# Patient Record
Sex: Male | Born: 1985 | Race: Black or African American | Hispanic: No | Marital: Single | Smoking: Never smoker
Health system: Southern US, Community
[De-identification: ages and names within clinical notes are randomized; demographics above are authoritative.]

## PROBLEM LIST (undated history)

## (undated) DIAGNOSIS — M1990 Unspecified osteoarthritis, unspecified site: Secondary | ICD-10-CM

## (undated) DIAGNOSIS — I10 Essential (primary) hypertension: Secondary | ICD-10-CM

---
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
## 2015-10-26 ENCOUNTER — Encounter (HOSPITAL_COMMUNITY): Payer: Self-pay | Admitting: Emergency Medicine

## 2015-10-26 ENCOUNTER — Emergency Department (HOSPITAL_COMMUNITY)
Admission: EM | Admit: 2015-10-26 | Discharge: 2015-10-26 | Disposition: A | Discharge status: Home / Self Care | Payer: No Typology Code available for payment source | Attending: Emergency Medicine | Admitting: Emergency Medicine

## 2015-10-26 DIAGNOSIS — Y999 Unspecified external cause status: Secondary | ICD-10-CM | POA: Diagnosis not present

## 2015-10-26 DIAGNOSIS — M25511 Pain in right shoulder: Secondary | ICD-10-CM

## 2015-10-26 DIAGNOSIS — Y939 Activity, unspecified: Secondary | ICD-10-CM | POA: Diagnosis not present

## 2015-10-26 DIAGNOSIS — Y92410 Unspecified street and highway as the place of occurrence of the external cause: Secondary | ICD-10-CM | POA: Diagnosis not present

## 2015-10-26 DIAGNOSIS — S161XXA Strain of muscle, fascia and tendon at neck level, initial encounter: Secondary | ICD-10-CM | POA: Diagnosis not present

## 2015-10-26 DIAGNOSIS — I10 Essential (primary) hypertension: Secondary | ICD-10-CM | POA: Diagnosis not present

## 2015-10-26 DIAGNOSIS — S199XXA Unspecified injury of neck, initial encounter: Secondary | ICD-10-CM | POA: Diagnosis present

## 2015-10-26 HISTORY — DX: Unspecified osteoarthritis, unspecified site: M19.90

## 2015-10-26 HISTORY — DX: Essential (primary) hypertension: I10

## 2015-10-26 MED ORDER — METHOCARBAMOL 500 MG PO TABS: 500.0000 mg | ORAL_TABLET | Freq: Every evening | ORAL | 0 refills | Status: DC | PRN

## 2015-10-26 MED ORDER — IBUPROFEN 400 MG PO TABS
600.0000 mg | ORAL_TABLET | Freq: Once | ORAL | Status: AC
  Administered 2015-10-26: 600 mg via ORAL
  Filled 2015-10-26: qty 1
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
## 2015-11-02 ENCOUNTER — Ambulatory Visit (INDEPENDENT_AMBULATORY_CARE_PROVIDER_SITE_OTHER): Payer: Self-pay

## 2015-11-02 ENCOUNTER — Encounter (HOSPITAL_COMMUNITY): Payer: Self-pay | Admitting: Emergency Medicine

## 2015-11-02 ENCOUNTER — Ambulatory Visit (HOSPITAL_COMMUNITY)
Admission: EM | Admit: 2015-11-02 | Discharge: 2015-11-02 | Disposition: A | Discharge status: Home / Self Care | Payer: Self-pay | Attending: Family Medicine | Admitting: Family Medicine

## 2015-11-02 DIAGNOSIS — S161XXD Strain of muscle, fascia and tendon at neck level, subsequent encounter: Secondary | ICD-10-CM

## 2015-11-02 DIAGNOSIS — S39012D Strain of muscle, fascia and tendon of lower back, subsequent encounter: Secondary | ICD-10-CM

## 2015-11-02 MED ORDER — CYCLOBENZAPRINE HCL 10 MG PO TABS: 10.0000 mg | ORAL_TABLET | Freq: Two times a day (BID) | ORAL | 0 refills | Status: DC | PRN
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
## 2018-03-23 ENCOUNTER — Emergency Department (HOSPITAL_COMMUNITY): Payer: Self-pay

## 2018-03-23 ENCOUNTER — Emergency Department (HOSPITAL_COMMUNITY)
Admission: EM | Admit: 2018-03-23 | Discharge: 2018-03-23 | Disposition: A | Discharge status: Home / Self Care | Payer: Self-pay | Attending: Emergency Medicine | Admitting: Emergency Medicine

## 2018-03-23 DIAGNOSIS — I10 Essential (primary) hypertension: Secondary | ICD-10-CM | POA: Insufficient documentation

## 2018-03-23 DIAGNOSIS — J101 Influenza due to other identified influenza virus with other respiratory manifestations: Secondary | ICD-10-CM | POA: Insufficient documentation

## 2018-03-23 LAB — CBC WITH DIFFERENTIAL/PLATELET
Abs Immature Granulocytes: 0.02 10*3/uL (ref 0.00–0.07)
BASOS ABS: 0 10*3/uL (ref 0.0–0.1)
Basophils Relative: 0 %
EOS ABS: 0 10*3/uL (ref 0.0–0.5)
Eosinophils Relative: 0 %
HEMATOCRIT: 47.8 % (ref 39.0–52.0)
HEMOGLOBIN: 15.7 g/dL (ref 13.0–17.0)
Immature Granulocytes: 0 %
LYMPHS ABS: 1.4 10*3/uL (ref 0.7–4.0)
LYMPHS PCT: 18 %
MCH: 27.7 pg (ref 26.0–34.0)
MCHC: 32.8 g/dL (ref 30.0–36.0)
MCV: 84.3 fL (ref 80.0–100.0)
MONO ABS: 0.5 10*3/uL (ref 0.1–1.0)
Monocytes Relative: 7 %
NRBC: 0 % (ref 0.0–0.2)
Neutro Abs: 5.8 10*3/uL (ref 1.7–7.7)
Neutrophils Relative %: 75 %
Platelets: 274 10*3/uL (ref 150–400)
RBC: 5.67 MIL/uL (ref 4.22–5.81)
RDW: 12.6 % (ref 11.5–15.5)
WBC: 7.7 10*3/uL (ref 4.0–10.5)

## 2018-03-23 LAB — URINALYSIS, ROUTINE W REFLEX MICROSCOPIC
BILIRUBIN URINE: NEGATIVE
GLUCOSE, UA: NEGATIVE mg/dL
KETONES UR: NEGATIVE mg/dL
Leukocytes, UA: NEGATIVE
Nitrite: NEGATIVE
PH: 6.5 (ref 5.0–8.0)
Protein, ur: NEGATIVE mg/dL
SPECIFIC GRAVITY, URINE: 1.01 (ref 1.005–1.030)

## 2018-03-23 LAB — BASIC METABOLIC PANEL
ANION GAP: 11 (ref 5–15)
BUN: 10 mg/dL (ref 6–20)
CALCIUM: 8.8 mg/dL — AB (ref 8.9–10.3)
CO2: 20 mmol/L — ABNORMAL LOW (ref 22–32)
CREATININE: 1.19 mg/dL (ref 0.61–1.24)
Chloride: 104 mmol/L (ref 98–111)
GFR calc Af Amer: >60 mL/min (ref >60)
GFR calc non Af Amer: >60 mL/min (ref >60)
Glucose, Bld: 110 mg/dL — ABNORMAL HIGH (ref 70–99)
POTASSIUM: 3.7 mmol/L (ref 3.5–5.1)
Sodium: 135 mmol/L (ref 135–145)

## 2018-03-23 LAB — URINALYSIS, MICROSCOPIC (REFLEX): WBC UA: NONE SEEN WBC/hpf (ref 0–5)

## 2018-03-23 LAB — I-STAT CG4 LACTIC ACID, ED: Lactic Acid, Venous: 1.42 mmol/L (ref 0.5–1.9)

## 2018-03-23 LAB — INFLUENZA PANEL BY PCR (TYPE A & B)
Influenza A By PCR: NEGATIVE
Influenza B By PCR: POSITIVE — AB

## 2018-03-23 MED ORDER — SODIUM CHLORIDE 0.9 % IV BOLUS
1000.0000 mL | Freq: Once | INTRAVENOUS | Status: AC
Start: 2018-03-23 — End: 2018-03-23
  Administered 2018-03-23: 1000 mL via INTRAVENOUS

## 2018-03-23 MED ORDER — ALBUTEROL SULFATE (2.5 MG/3ML) 0.083% IN NEBU
2.5000 mg | INHALATION_SOLUTION | Freq: Once | RESPIRATORY_TRACT | Status: AC
  Administered 2018-03-23: 2.5 mg via RESPIRATORY_TRACT
  Filled 2018-03-23: qty 3

## 2018-03-23 MED ORDER — ACETAMINOPHEN 325 MG PO TABS
650.0000 mg | ORAL_TABLET | Freq: Once | ORAL | Status: AC
Start: 2018-03-23 — End: 2018-03-23
  Administered 2018-03-23: 650 mg via ORAL
  Filled 2018-03-23: qty 2

## 2018-03-23 NOTE — ED Notes (Signed)
ED Provider at bedside. 

## 2018-03-23 NOTE — ED Triage Notes (Signed)
Pt reports flu like symptoms since Tuesday (cough, congestion, chills, body aches) pt taking tylenol and theraflu with no relief of symptoms. Pt also having dizziness.

## 2018-03-23 NOTE — ED Provider Notes (Signed)
[facility]   CSN: [id number] Arrival date & time: 03/23/18  1916     History   Chief Complaint Chief Complaint  Patient presents with  . Generalized Body Aches  . Dizziness    HPI Kerby Lessathaniel Devon Adela LankFloyd is a 32 y.o. male.  32 year old male presents with complaint of body aches, fevers, chills, cough, congestion x5 days.  Patient has been taking Tylenol at home with limited relief of his symptoms, today took Tylenol and TheraFlu at noon.  Patient has a history of asthma, states he is been using his inhaler more often than usual with some relief.  Also reports pain and swelling of his gums. Patient is an occasional smoker, does not vape.  Reports child at home with 1 day of fever and sleeping more otherwise no complaints and healthy.  Patient reports one prior admission related to his asthma, no prior intubations.  Other complaints or concerns.     Past Medical History:  Diagnosis Date  . Arthritis   . Hypertension     There are no active problems to display for this patient.   No past surgical history on file.      Home Medications    Prior to Admission medications   Medication Sig Start Date End Date Taking? Authorizing Provider  cyclobenzaprine (FLEXERIL) 10 MG tablet Take 1 tablet (10 mg total) by mouth 2 (two) times daily as needed for muscle spasms. 11/02/15   Tharon AquasPatrick, Frank C, PA  methocarbamol (ROBAXIN) 500 MG tablet Take 1 tablet (500 mg total) by mouth at bedtime and may repeat dose one time if needed. 10/26/15   Bethel BornGekas, Kelly Marie, PA-C    Family History No family history on file.  Social History Social History   Tobacco Use  . Smoking status: Never Smoker  Substance Use Topics  . Alcohol use: Yes  . Drug use: No     Allergies   Patient has no known allergies.   Review of Systems Review of Systems  Constitutional: Positive for chills, diaphoresis and fever.  HENT: Positive for congestion.  Negative for sore throat.   Respiratory: Positive for cough and shortness of breath.   Cardiovascular: Negative for chest pain.  Gastrointestinal: Positive for nausea. Negative for abdominal pain, constipation, diarrhea and vomiting.  Genitourinary: Negative for decreased urine volume.  Musculoskeletal: Positive for arthralgias and myalgias. Negative for joint swelling, neck pain and neck stiffness.  Skin: Negative for rash.  Allergic/Immunologic: Negative for immunocompromised state.  Neurological: Positive for dizziness. Negative for headaches.  Hematological: Negative for adenopathy.  Psychiatric/Behavioral: Negative for confusion.  All other systems reviewed and are negative.    Physical Exam Updated Vital Signs BP 116/84   Pulse (!) 104   Temp (!) 102.4 F (39.1 C) (Oral)   Resp 18   Ht 6' (1.829 m)   Wt 113.4 kg   SpO2 95%   BMI 33.91 kg/m   Physical Exam Vitals signs and nursing note reviewed.  Constitutional:      General: He is not in acute distress.    Appearance: Normal appearance. He is well-developed. He is not diaphoretic.  HENT:     Head: Normocephalic and atraumatic.     Right Ear: Tympanic membrane and ear canal normal.     Left Ear: Tympanic membrane and ear canal normal.     Nose: Congestion present.     Mouth/Throat:     Mouth: Mucous membranes are moist. No injury or oral  lesions.     Dentition: Gingival swelling present. No gum lesions.     Pharynx: Uvula midline.     Comments: No trismus  Neck:     Musculoskeletal: Neck supple.  Cardiovascular:     Rate and Rhythm: Regular rhythm. Tachycardia present.     Pulses: Normal pulses.     Heart sounds: Normal heart sounds. No murmur.  Pulmonary:     Effort: Pulmonary effort is normal.     Breath sounds: Rhonchi present.  Abdominal:     Tenderness: There is no abdominal tenderness.  Musculoskeletal:        General: No swelling.  Lymphadenopathy:     Cervical: No cervical adenopathy.  Skin:     General: Skin is warm and dry.     Coloration: Skin is not pale.     Findings: No rash.  Neurological:     Mental Status: He is alert and oriented to person, place, and time.  Psychiatric:        Mood and Affect: Mood normal.        Behavior: Behavior normal.      ED Treatments / Results  Labs (all labs ordered are listed, but only abnormal results are displayed) Labs Reviewed  BASIC METABOLIC PANEL - Abnormal; Notable for the following components:      Result Value   CO2 20 (*)    Glucose, Bld 110 (*)    Calcium 8.8 (*)    All other components within normal limits  URINALYSIS, ROUTINE W REFLEX MICROSCOPIC - Abnormal; Notable for the following components:   Hgb urine dipstick TRACE (*)    All other components within normal limits  URINALYSIS, MICROSCOPIC (REFLEX) - Abnormal; Notable for the following components:   Bacteria, UA RARE (*)    All other components within normal limits  CBC WITH DIFFERENTIAL/PLATELET  INFLUENZA PANEL BY PCR (TYPE A & B)  I-STAT CG4 LACTIC ACID, ED  I-STAT CG4 LACTIC ACID, ED    EKG None  Radiology Dg Chest 2 View  Result Date: 03/23/2018 CLINICAL DATA:  Cough, short of breath EXAM: CHEST - 2 VIEW COMPARISON:  None. FINDINGS: The heart size and mediastinal contours are within normal limits. Both lungs are clear. The visualized skeletal structures are unremarkable. IMPRESSION: No active cardiopulmonary disease. Electronically Signed   By: Jasmine PangKim  Fujinaga M.D.   On: 03/23/2018 20:52    Procedures Procedures (including critical care time)  Medications Ordered in ED Medications  sodium chloride 0.9 % bolus 1,000 mL (1,000 mLs Intravenous New Bag/Given 03/23/18 2010)  acetaminophen (TYLENOL) tablet 650 mg (650 mg Oral Given 03/23/18 1959)  albuterol (PROVENTIL) (2.5 MG/3ML) 0.083% nebulizer solution 2.5 mg (2.5 mg Nebulization Given 03/23/18 1959)     Initial Impression / Assessment and Plan / ED Course  I have reviewed the triage vital  signs and the nursing notes.  Pertinent labs & imaging results that were available during my care of the patient were reviewed by me and considered in my medical decision making (see chart for details).  Clinical Course as of Mar 24 2127  Wynelle LinkSun Mar 23, 2018  2126 32yo male with complaint of flu like symptoms/URI x 5 days, febrile/tachycardic with course lung sounds on exam. Given IV fluids, Tylenol, neb treatment. Labs including CBC, BMP, UA, lactic acid reassuring. Care signed out to Dr. Effie ShyWentz pending flu swab and reassessment.    [LM]    Clinical Course User Index [LM] Jeannie FendMurphy, Paw Karstens A, PA-C   Final Clinical  Impressions(s) / ED Diagnoses   Final diagnoses:  None    ED Discharge Orders    None       Alden Hipp 03/23/18 2128    Mancel Bale, MD 03/23/18 2252

## 2018-03-23 NOTE — ED Provider Notes (Signed)
  Face-to-face evaluation   History: He has been ill for 5 days with fever, chills, cough and congestion.  He is using over-the-counter medications, without relief.  Physical exam: Alert, calm, cooperative.  No respiratory distress.  Mouth with nonspecific mild gum swelling.  No exudate.  Moist mucous membranes.  He is lucid.  MDM-short-term illness consistent with viral epidemic influenza.  He has been sick 5 days, no indication for Tamiflu.  Nonspecific gum swelling may be related to influenza.  No serious bacterial infection or metabolic instability.  Medical screening examination/treatment/procedure(s) were conducted as a shared visit with non-physician practitioner(s) and myself.  I personally evaluated the patient during the encounter    Mancel BaleWentz, Tasean Mancha, MD 03/23/18 2252

## 2018-03-23 NOTE — Discharge Instructions (Addendum)
Get plenty of rest and drink a lot of fluids.  Use warm salt water rinse and spit to decrease discomfort in mouth.  Use Tylenol or Motrin for pain and fever.  Avoid contact with people until you are fever has resolved for 24 to 48 hours.  Return here if your symptoms worsen or you have other concerns.

## 2021-11-14 ENCOUNTER — Other Ambulatory Visit: Payer: Self-pay

## 2021-11-14 ENCOUNTER — Ambulatory Visit
Admission: RE | Admit: 2021-11-14 | Discharge: 2021-11-14 | Disposition: A | Discharge status: Home / Self Care | Payer: Self-pay | Source: Ambulatory Visit | Attending: Family Medicine | Admitting: Family Medicine

## 2021-11-14 VITALS — BP 148/93 | HR 90 | Temp 98.7°F | Resp 20

## 2021-11-14 DIAGNOSIS — R3915 Urgency of urination: Secondary | ICD-10-CM | POA: Insufficient documentation

## 2021-11-14 DIAGNOSIS — Z113 Encounter for screening for infections with a predominantly sexual mode of transmission: Secondary | ICD-10-CM | POA: Insufficient documentation

## 2021-11-14 LAB — POCT URINALYSIS DIP (MANUAL ENTRY)
Bilirubin, UA: NEGATIVE
Glucose, UA: NEGATIVE mg/dL
Ketones, POC UA: NEGATIVE mg/dL
Leukocytes, UA: NEGATIVE
Nitrite, UA: NEGATIVE
Spec Grav, UA: >=1.03 — AB (ref 1.010–1.025)
Urobilinogen, UA: 0.2 E.U./dL
pH, UA: 6 (ref 5.0–8.0)

## 2021-11-14 NOTE — ED Triage Notes (Signed)
Patient reports feeling he has to urinate, but can't, uncomfortable feeling.  Denies penile discharge.  Symptoms started approximately one week ago.

## 2021-11-14 NOTE — ED Provider Notes (Signed)
[facility]    CSN: [id number] Arrival date & time: 11/14/21  1818      History   Chief Complaint Chief Complaint  Patient presents with  . SEXUALLY TRANSMITTED DISEASE    HPI John Francis is a 36 y.o. male.   HPI  Past Medical History:  Diagnosis Date  . Arthritis   . Hypertension     There are no problems to display for this patient.   History reviewed. No pertinent surgical history.     Home Medications    Prior to Admission medications   Medication Sig Start Date End Date Taking? Authorizing Provider  Multiple Vitamins-Minerals (ONE-A-DAY MENS VITACRAVES PO) Take by mouth.   Yes [provider]  cyclobenzaprine (FLEXERIL) 10 MG tablet Take 1 tablet (10 mg total) by mouth 2 (two) times daily as needed for muscle spasms. Patient not taking: Reported on 11/14/2021 11/02/15   Tharon Aquas, PA  methocarbamol (ROBAXIN) 500 MG tablet Take 1 tablet (500 mg total) by mouth at bedtime and may repeat dose one time if needed. Patient not taking: Reported on 11/14/2021 10/26/15   Bethel Born, PA-C    Family History History reviewed. No pertinent family history.  Social History Social History   Tobacco Use  . Smoking status: Never  Substance Use Topics  . Alcohol use: Yes  . Drug use: No     Allergies   Patient has no known allergies.   Review of Systems Review of Systems   Physical Exam Triage Vital Signs ED Triage Vitals  Enc Vitals Group     BP 11/14/21 1841 (!) 172/112     Pulse Rate 11/14/21 1841 90     Resp 11/14/21 1841 20     Temp 11/14/21 1841 98.7 F (37.1 C)     Temp Source 11/14/21 1841 Oral     SpO2 11/14/21 1841 98 %     Weight --      Height --      Head Circumference --      Peak Flow --      Pain Score 11/14/21 1838 5     Pain Loc --      Pain Edu? --      Excl. in GC? --    No data found.  Updated Vital Signs BP (!) 148/93 (BP Location: Left Arm)   Pulse 90   Temp 98.7 F (37.1 C)  (Oral)   Resp 20   SpO2 98%   Visual Acuity Right Eye Distance:   Left Eye Distance:   Bilateral Distance:    Right Eye Near:   Left Eye Near:    Bilateral Near:     Physical Exam   UC Treatments / Results  Labs (all labs ordered are listed, but only abnormal results are displayed) Labs Reviewed  POCT URINALYSIS DIP (MANUAL ENTRY)  CYTOLOGY, (ORAL, ANAL, URETHRAL) ANCILLARY ONLY    EKG   Radiology No results found.  Procedures Procedures (including critical care time)  Medications Ordered in UC Medications - No data to display  Initial Impression / Assessment and Plan / UC Course  I have reviewed the triage vital signs and the nursing notes.  Pertinent labs & imaging results that were available during my care of the patient were reviewed by me and considered in my medical decision making (see chart for details).     *** Final Clinical Impressions(s) / UC Diagnoses   Final diagnoses:  Screen for STD (sexually  transmitted disease)  Urgency of micturition   Discharge Instructions   None    ED Prescriptions   None    PDMP not reviewed this encounter.

## 2021-11-14 NOTE — Discharge Instructions (Signed)
If your STD test results are negative, follow-up with [facility].  Your results will be available within 48-72 hours and will update directly to My chart

## 2021-11-16 LAB — CYTOLOGY, (ORAL, ANAL, URETHRAL) ANCILLARY ONLY
Chlamydia: NEGATIVE
Comment: NEGATIVE
Comment: NEGATIVE
Comment: NORMAL
Neisseria Gonorrhea: NEGATIVE
Trichomonas: NEGATIVE

## 2021-11-30 ENCOUNTER — Ambulatory Visit (INDEPENDENT_AMBULATORY_CARE_PROVIDER_SITE_OTHER): Payer: Self-pay | Admitting: Urology

## 2021-11-30 ENCOUNTER — Encounter: Payer: Self-pay | Admitting: Urology

## 2021-11-30 VITALS — BP 142/90 | HR 97 | Ht 72.0 in | Wt 282.0 lb

## 2021-11-30 DIAGNOSIS — R300 Dysuria: Secondary | ICD-10-CM

## 2021-11-30 DIAGNOSIS — R3915 Urgency of urination: Secondary | ICD-10-CM

## 2021-11-30 LAB — URINALYSIS, COMPLETE
Bilirubin, UA: NEGATIVE
Glucose, UA: NEGATIVE
Ketones, UA: NEGATIVE
Leukocytes,UA: NEGATIVE
Nitrite, UA: NEGATIVE
Protein,UA: NEGATIVE
RBC, UA: NEGATIVE
Specific Gravity, UA: 1.015 (ref 1.005–1.030)
Urobilinogen, Ur: 0.2 mg/dL (ref 0.2–1.0)
pH, UA: 5.5 (ref 5.0–7.5)

## 2021-11-30 LAB — MICROSCOPIC EXAMINATION: Bacteria, UA: NONE SEEN

## 2021-11-30 LAB — BLADDER SCAN AMB NON-IMAGING

## 2021-11-30 MED ORDER — CELECOXIB 200 MG PO CAPS: 200.0000 mg | ORAL_CAPSULE | Freq: Two times a day (BID) | ORAL | 0 refills | Status: DC

## 2021-11-30 MED ORDER — ACYCLOVIR 400 MG PO TABS: 400.0000 mg | ORAL_TABLET | Freq: Three times a day (TID) | ORAL | 0 refills | Status: DC

## 2021-11-30 MED ORDER — CELECOXIB 200 MG PO CAPS: 200.0000 mg | ORAL_CAPSULE | Freq: Two times a day (BID) | ORAL | 0 refills | Status: AC

## 2021-11-30 NOTE — Progress Notes (Signed)
   11/30/21 12:29 PM   Gardiner Barefoot Clint Guy 29-Oct-1985 [id number]  CC: Dysuria, urinary frequency  HPI: 36 year old male who reports at least 2 weeks of dysuria and some urinary frequency as well as nocturia of unclear etiology.  He denies any gross hematuria.  Was seen in urgent care and urinalysis and STD testing was negative, and he was empirically treated with ceftriaxone and doxycycline but has had no improvement in his urinary symptoms.  He denies any prior similar episodes, no change in medications, drinks only water during the day.  He is sexually active with 1 partner, she lives in a different city, and he is unsure if she has other partners.  Urinalysis today is benign, PVR is normal at 81ml.   PMH: Past Medical History:  Diagnosis Date   Arthritis    Hypertension      Social History:  reports that he has never smoked. He has never been exposed to tobacco smoke. He has never used smokeless tobacco. He reports current alcohol use. He reports that he does not use drugs.  Physical Exam: BP (!) 142/90   Pulse 97   Ht 6' (1.829 m)   Wt 282 lb (127.9 kg)   BMI 38.25 kg/m    Constitutional:  Alert and oriented, No acute distress. Cardiovascular: No clubbing, cyanosis, or edema. Respiratory: Normal respiratory effort, no increased work of breathing. GI: Abdomen is soft, nontender, nondistended, no abdominal masses GU: Phallus with patent meatus, no lesions, testicles 20 cc and descended bilaterally without masses  Laboratory Data: Reviewed  Assessment & Plan:   36 year old male with 2 weeks of dysuria and urinary frequency of unclear etiology.  Urinalysis and physical exam benign, no improvement on empiric ceftriaxone/doxycycline from urgent care previously.  We reviewed possible etiologies including infectious, STD/herpes, inflammation, urethral stricture, or less likely malignancy.  He was interested in a trial of Celebrex and acyclovir prior to considering cystoscopy,  but I recommended considering cystoscopy if he has no improvement over the next 1 to 2 weeks.  He will call to keep [place] updated.  Consider cystoscopy or further imaging if no improvement.  Proceed with cystoscopy if persistent urinary symptoms despite course of Celebrex and acyclovir  Legrand Rams, MD 11/30/2021  [facility] [address], [address] [place], [place] [address] [phone]

## 2021-11-30 NOTE — Patient Instructions (Signed)
Urethritis, Adult  Urethritis is a swelling (inflammation) of the urethra. The urethra is the tube that drains urine from the bladder. It is important to get treatment for this condition early. Delayed treatment may lead to complications, such as an infection in the urinary tract (ureters, kidneys, and bladder). What are the causes? This condition may be caused by: Germs that are spread through sexual contact. This is the leading cause of urethritis. This may include bacterial or viral infections. Injury to the urethra. This can happen after a thin, flexible tube (catheter) is inserted into the urethra to drain urine, or after medical instruments or foreign bodies are inserted into the area. Chemical irritation. This may include contact with spermicide or prolonged contact with chemicals in bubble bath, shampoo, or perfumed soaps. A disease that causes inflammation. This is rare. What increases the risk? The following factors may make you more likely to develop this condition: Having sex without using a condom. Having multiple sexual partners. Having poor hygiene. What are the signs or symptoms? Symptoms of this condition include: Pain with urination. Frequent urination. An urgent need to urinate. Itching and pain in the vagina or penis. Discharge or bleeding coming from the penis. Most women have no symptoms. How is this diagnosed? This condition may be diagnosed based on: Your symptoms. Your medical history. A physical exam. Tests may also be done. These may include: Urine tests. Swabs from the urethra. How is this treated? Treatment for this condition depends on the cause. Urethritis caused by a bacterial infection is treated with antibiotic medicine. Sexual partners must also be treated. Follow these instructions at home: Medicines Take over-the-counter and prescription medicines only as told by your health care provider. If you were prescribed an antibiotic, take it as told  by your health care provider. Do not stop taking the antibiotic even if you start to feel better. Lifestyle Avoid using perfumed soaps, bubble bath, and shampoo when you bathe or shower. Rinse the vaginal area after bathing. Wear cotton underwear. Not wearing underwear when going to sleep can help. Make sure to wipe from front to back after using the toilet if you are male. Do not have sex until your health care provider approves. When you do have sex, be sure to practice safe sex. Any sexual partners you have had in the past 60 days should be treated. General instructions Drink enough fluid to keep your urine pale yellow. It is up to you to get your test results. Ask your health care provider, or the department that is doing the test, when your results will be ready. Keep all follow-up visits. This is important. Get tested again 3 months after treatment to make sure the infection is gone. It is important that your sexual partner also gets tested again. Contact a health care provider if: Your symptoms have not improved after 3 days. Your symptoms get worse. You have eye redness or pain. You develop abdominal pain or pelvic pain (in females). You develop joint pain or a rash. You have a fever or chills. Get help right away if: You have severe pain in the belly, back, or side. You vomit repeatedly. Summary Urethritis is a swelling (inflammation) of the urethra. Germs that are spread through sexual contact are the most common cause of this condition. It is important to get treatment for this condition early. Delayed treatment may lead to complications. Treatment for this condition depends on the cause. Any sexual partners must also be treated. This information is not   intended to replace advice given to you by your health care provider. Make sure you discuss any questions you have with your health care provider. Document Revised: 10/25/2019 Document Reviewed: 10/25/2019 [employer] Patient  Education  2023 [employer]  

## 2021-12-07 ENCOUNTER — Other Ambulatory Visit: Payer: Self-pay | Admitting: Urology

## 2021-12-07 ENCOUNTER — Other Ambulatory Visit: Payer: Self-pay

## 2021-12-07 DIAGNOSIS — R3915 Urgency of urination: Secondary | ICD-10-CM

## 2021-12-07 DIAGNOSIS — L293 Anogenital pruritus, unspecified: Secondary | ICD-10-CM

## 2021-12-07 MED ORDER — ACYCLOVIR 400 MG PO TABS: 400.0000 mg | ORAL_TABLET | Freq: Three times a day (TID) | ORAL | 0 refills | Status: AC
